# Patient Record
Sex: Male | Born: 1990 | Race: Black or African American | Hispanic: No | Marital: Single | Smoking: Current every day smoker
Health system: Southern US, Community
[De-identification: ages and names within clinical notes are randomized; demographics above are authoritative.]

---
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
## 2014-06-22 ENCOUNTER — Emergency Department (HOSPITAL_BASED_OUTPATIENT_CLINIC_OR_DEPARTMENT_OTHER)
Admission: EM | Admit: 2014-06-22 | Discharge: 2014-06-22 | Disposition: A | Discharge status: Home / Self Care | Payer: Self-pay | Attending: Emergency Medicine | Admitting: Emergency Medicine

## 2014-06-22 ENCOUNTER — Encounter (HOSPITAL_BASED_OUTPATIENT_CLINIC_OR_DEPARTMENT_OTHER): Payer: Self-pay | Admitting: Emergency Medicine

## 2014-06-22 ENCOUNTER — Emergency Department (HOSPITAL_BASED_OUTPATIENT_CLINIC_OR_DEPARTMENT_OTHER): Payer: Self-pay

## 2014-06-22 DIAGNOSIS — S022XXA Fracture of nasal bones, initial encounter for closed fracture: Secondary | ICD-10-CM

## 2014-06-22 DIAGNOSIS — Z23 Encounter for immunization: Secondary | ICD-10-CM | POA: Insufficient documentation

## 2014-06-22 DIAGNOSIS — F17200 Nicotine dependence, unspecified, uncomplicated: Secondary | ICD-10-CM | POA: Insufficient documentation

## 2014-06-22 DIAGNOSIS — S025XXA Fracture of tooth (traumatic), initial encounter for closed fracture: Secondary | ICD-10-CM

## 2014-06-22 MED ORDER — TETANUS-DIPHTH-ACELL PERTUSSIS 5-2.5-18.5 LF-MCG/0.5 IM SUSP
0.5000 mL | Freq: Once | INTRAMUSCULAR | Status: AC
  Administered 2014-06-22: 0.5 mL via INTRAMUSCULAR
  Filled 2014-06-22: qty 0.5

## 2014-06-22 MED ORDER — OXYCODONE-ACETAMINOPHEN 5-325 MG PO TABS: 1.0000 | ORAL_TABLET | Freq: Four times a day (QID) | ORAL | Status: AC | PRN

## 2014-06-22 MED ORDER — OXYCODONE-ACETAMINOPHEN 5-325 MG PO TABS
1.0000 | ORAL_TABLET | Freq: Once | ORAL | Status: AC
  Administered 2014-06-22: 1 via ORAL
  Filled 2014-06-22: qty 1

## 2014-06-22 NOTE — Discharge Instructions (Signed)
Dental Fracture You have a dental fracture or injury. This can mean the tooth is loose, has a chip in the enamel or is broken. If just the outer enamel is chipped, there is a good chance the tooth will not become infected. The only treatment needed may be to smooth off a rough edge. Fractures into the deeper layers (dentin and pulp) cause greater pain and are more likely to become infected. These require you to see a dentist as soon as possible to save the tooth. Loose teeth may need to be wired or bonded with a plastic splint to hold them in place. A paste may be painted on the open area of the broken tooth to reduce the pain. Antibiotics and pain medicine may be prescribed. Choosing a soft or liquid diet and rinsing the mouth out with warm water after meals may be helpful. See your dentist as recommended. Failure to seek care or follow up with a dentist or other specialist as recommended could result in the loss of your tooth, infection, or permanent dental problems. SEEK MEDICAL CARE IF:   You have increased pain not controlled with medicines.  You have swelling around the tooth, in the face or neck.  You have bleeding which starts, continues, or gets worse.  You have a fever. Document Released: 12/30/2004 Document Revised: 02/14/2012 Document Reviewed: 10/14/2009 [facility] Patient Information 2015 [place], [place]. This information is not intended to replace advice given to you by your health care provider. Make sure you discuss any questions you have with your health care provider.   Emergency Department Resource Guide 1) Find a Doctor and Pay Out of Pocket Although you won't have to find out who is covered by your insurance plan, it is a good idea to ask around and get recommendations. You will then need to call the office and see if the doctor you have chosen will accept you as a new patient and what types of options they offer for patients who are self-pay. Some doctors offer discounts or  will set up payment plans for their patients who do not have insurance, but you will need to ask so you aren't surprised when you get to your appointment.  2) Contact Your Local Health Department Not all health departments have doctors that can see patients for sick visits, but many do, so it is worth a call to see if yours does. If you don't know where your local health department is, you can check in your phone book. The CDC also has a tool to help you locate your state's health department, and many state websites also have listings of all of their local health departments.  3) Find a Walk-in Clinic If your illness is not likely to be very severe or complicated, you may want to try a walk in clinic. These are popping up all over the country in pharmacies, drugstores, and shopping centers. They're usually staffed by nurse practitioners or physician assistants that have been trained to treat common illnesses and complaints. They're usually fairly quick and inexpensive. However, if you have serious medical issues or chronic medical problems, these are probably not your best option.  No Primary Care Doctor: - Call Health Connect at  [phone] - they can help you locate a primary care doctor that  accepts your insurance, provides certain services, etc. - Physician Referral Service- [phone]  Chronic Pain Problems: Organization         Address  Phone   Notes  [facility]  [phone] Patients need to be referred by their primary care doctor.   Medication Assistance: Organization         Address  Phone   Notes  [facility] Medication [facility], [address] [place], [place] [address] [phone] --Must be a resident of [facility] -- Must have NO insurance coverage whatsoever (no Medicaid/ Medicare, etc.) -- The pt. MUST have a primary care doctor that directs their care regularly and follows them in the community   [facility]  [phone]   [employer]  [phone]    Agencies that provide inexpensive medical care: Organization         Address  Phone   Notes  [place]  [phone]   [facility]    [phone]   [facility] [place], [place] [address] [phone]   [place] [address], [place] [phone]   Planned Parenthood    [phone]   [facility]    [phone]   [place] [address] [place] [address], [place] Phone:  [phone], Fax:  [phone] Hours of Operation:  9 am - 6 pm, M-F.  Also accepts Medicaid/Medicare and self-pay.  [facility] for [address], [address], [place] Phone: [phone], Fax: [phone]. Hours of Operation:  8:30 am - 5:30 pm, M-F.  Also accepts Medicaid and self-pay.  [facility] [place] [address] [place] Phone: [phone]   [place], [place], [place] [phone], Ext. [phone] Mondays & Thursdays: 7-9 AM.  First 15 patients are seen on a first come, first serve basis.    [place] Providers:  Organization         Address  Phone   Notes  [facility] [address], [place] [phone] Also accepts self-pay patients.  [facility] [address], [place]  [phone]   [place], [address], [place] [phone]   [facility] Family Medicine [address] [place] [phone]   Lucianne Lei [address], [place]   [phone] Only accepts [place] Access [place] patients after they have their name applied to their card.   Self-Pay (no insurance) in [facility]:  Organization         Address  Phone   Notes  Sickle Cell Patients, [facility] Internal Medicine [place] [phone]   [facility] [place] [phone]   [facility] [place]  [place], [place], [place] [phone]   [facility]/Dr. Osei-Bonsu  [address], [place] [address], [place] [phone] Phone number for both [place] and [place] locations is the same.  [facility] and [facility] [address] [place] [phone]   [facility] [address] [place] or [address] [phone]   [facility] [address] [place] [phone], phone; [phone], fax Sees patients 1st and 3rd Saturday of every month.  Must not qualify for public or private insurance (i.e. Medicaid, Medicare, [place] Health Choice,  Veterans' Benefits)  Household income should be no more than 200% of the poverty level The clinic cannot treat you if you are pregnant or think you are pregnant  Sexually transmitted diseases are not treated at the clinic.    Dental Care: Organization         Address  Phone  Notes  [facility] [address], [place] [phone] Accepts children up to age 22 who are enrolled in [place] or [place] Health Choice; pregnant women with a Medicaid card; and children who have applied for Medicaid or [place] Health Choice, but were declined, whose parents can pay a reduced fee at time of service.  [facility] [place]  [address], [place] [phone] Accepts children up to age 22 who are enrolled in [place] or [place] Health Choice; pregnant women with a Medicaid card; and children who have applied for Medicaid or [place] Health Choice, but were declined, whose parents can pay a reduced fee at time of service.  [facility]  [address], [place] [phone]  [phone] Patients are seen by appointment only. Walk-ins are not accepted. [facility] will see patients 22 years of age and older. Monday - Tuesday (8am-5pm) Most Wednesdays (8:30-5pm) $30 per visit, cash only  [facility]  [address], [facility] [phone] Patients are seen by appointment only. Walk-ins are not accepted. [facility] will see patients 22 years of age and older. One Wednesday Evening (Monthly: Volunteer Based).  $30 per visit, cash only  [employer] [facility]  [phone] for adults; Children under age 584, call Graduate Pediatric Dentistry at [phone]. Children aged 504-14, please call [phone] to request a pediatric application.  Dental services are provided in all areas of dental care including fillings, crowns and bridges, complete and partial dentures, implants, gum treatment, root canals, and extractions. Preventive care is also provided. Treatment is provided to both adults and children. Patients are selected via a lottery and there is often a waiting list.   [facility] [address], [place]  [phone] [email or web address]   [facility] [address], [place], [place] [phone], Ext. [phone] Second and Fourth Thursday of each month, opens at 6:30 AM; Clinic ends at 9 AM.  Patients are seen on a first-come first-served basis, and a limited number are seen during each clinic.   [facility]  [address], [place], [place] [phone]   Eligibility Requirements You must have lived in [place], [place], or [place] counties for at least the last three months.   You cannot be eligible for state or federal sponsored [place] Cityhealthcare insurance, including CIGNAVeterans Administration, [place]icaid, or [employer] EntertainmentMedicare.   You generally cannot be eligible for healthcare insurance through your employer.    How to apply: Eligibility screenings are held every Tuesday and Wednesday afternoon  from 1:00 pm until 4:00 pm. You do not need an appointment for the interview!  [facility] [address], [place], [place] [phone]   [facility]  [phone]   [facility]  [phone]   [facility]  [phone]    Behavioral Health Resources in the Community: Intensive Outpatient Programs Organization         Address  Phone  Notes  [facility] [address], [place], [place] [phone]   [facility] Outpatient [address] [place], [place] [phone]   [facility] [address] [place], [place]  [phone]   [facility] Mental Health [address],  [place], [place] [phone] or [phone]   Substance Abuse Resources Organization         Address  Phone  Notes  [facility] Drug Services  [phone]   [facility]  [phone]   [facility] [place]  [phone]   Floydene Flock  [phone]   Residential & Outpatient Substance Abuse Program  [phone]   Psychological Services Organization         Address  Phone  Notes  [facility] Behavioral Health  [phone]   [facility]  [phone]   [facility] Mental Health [address] [place] [phone] or [phone]    Mobile Crisis Teams Organization         Address  Phone  Notes  [facility], Mobile Crisis Care Unit  [phone]   [facility]  [address] [place], [place] [phone]   Doristine Locks [address] [place] [phone]    Self-Help/Support Groups Organization         Address  Phone             Notes  Mental Health Assoc. of  - variety of support groups  336- I7437963 Call for more information  Narcotics Anonymous (NA), [facility] [address], [employer] [place]  2 meetings at this location   Nutritional therapist         Address  Phone  Notes  [facility] [address],    [place]  [phone]   [facility]  [address], [place] [address], [place], [place] [phone]   [facility] [address] [place], [place] [phone] Admissions: 8am-3pm M-F  [facility] [address]    [place], [place] [phone]   [facility] [address] [place], [place], [place] [phone]   [facility] [address],  [place], [place] [phone]   [facility] - Intensive Outpatient [address], [place], [place] [phone]   [facility] ([facility]iction [facility] [address] [place].,  [place], [place] [phone] or [phone]   [facility] ([facility]) [address], [place], [place] [phone] Accepts Medicaid  [facility] [place] [address]  [place] [phone] Substance Abuse/Addiction Treatment   [facility] Organization         Address  Phone  Notes  [facility]  [phone]   Angie Fava, PhD [address] [place], [place]   [phone] or [phone]   [facility] Behavioral   [address] [place], [place] [phone]   [facility] [address] [place], [place] [phone] Insurance/Medicaid/sponsorship through [facility] and Families [address]                                    [place], [place] [phone] Therapy/tele-psych/case  [facility] [address], [place] [phone]    Dr. Lolly Mustache  [phone]   [facility] [place]  [facility] Dept. 1) [address], [place] 2)  [place] 3)  [address], [place] [phone]  [phone]   [facility] Child Abuse Hotline [phone]) [phone] or [phone] (After Hours)
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
## 2014-06-22 NOTE — ED Notes (Signed)
Pt discharged to home with family. NAD.  
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
## 2017-02-28 ENCOUNTER — Emergency Department (HOSPITAL_BASED_OUTPATIENT_CLINIC_OR_DEPARTMENT_OTHER)
Admission: EM | Admit: 2017-02-28 | Discharge: 2017-02-28 | Disposition: A | Discharge status: Home / Self Care | Payer: Self-pay | Attending: Emergency Medicine | Admitting: Emergency Medicine

## 2017-02-28 ENCOUNTER — Emergency Department (HOSPITAL_BASED_OUTPATIENT_CLINIC_OR_DEPARTMENT_OTHER): Payer: Self-pay

## 2017-02-28 ENCOUNTER — Encounter (HOSPITAL_BASED_OUTPATIENT_CLINIC_OR_DEPARTMENT_OTHER): Payer: Self-pay

## 2017-02-28 DIAGNOSIS — Y999 Unspecified external cause status: Secondary | ICD-10-CM | POA: Insufficient documentation

## 2017-02-28 DIAGNOSIS — Y9389 Activity, other specified: Secondary | ICD-10-CM | POA: Insufficient documentation

## 2017-02-28 DIAGNOSIS — Y929 Unspecified place or not applicable: Secondary | ICD-10-CM | POA: Insufficient documentation

## 2017-02-28 DIAGNOSIS — S61230A Puncture wound without foreign body of right index finger without damage to nail, initial encounter: Secondary | ICD-10-CM | POA: Insufficient documentation

## 2017-02-28 DIAGNOSIS — L089 Local infection of the skin and subcutaneous tissue, unspecified: Secondary | ICD-10-CM

## 2017-02-28 DIAGNOSIS — F17200 Nicotine dependence, unspecified, uncomplicated: Secondary | ICD-10-CM | POA: Insufficient documentation

## 2017-02-28 DIAGNOSIS — W503XXA Accidental bite by another person, initial encounter: Secondary | ICD-10-CM

## 2017-02-28 DIAGNOSIS — S6000XA Contusion of unspecified finger without damage to nail, initial encounter: Secondary | ICD-10-CM

## 2017-02-28 MED ORDER — IBUPROFEN 400 MG PO TABS
600.0000 mg | ORAL_TABLET | Freq: Once | ORAL | Status: AC
  Administered 2017-02-28: 600 mg via ORAL
  Filled 2017-02-28: qty 1

## 2017-02-28 MED ORDER — NAPROXEN 500 MG PO TABS: 500.0000 mg | ORAL_TABLET | Freq: Two times a day (BID) | ORAL | 0 refills | Status: AC

## 2017-02-28 MED ORDER — SODIUM CHLORIDE 0.9 % IV SOLN
3.0000 g | Freq: Once | INTRAVENOUS | Status: AC
  Administered 2017-02-28: 3 g via INTRAVENOUS
  Filled 2017-02-28: qty 3

## 2017-02-28 MED ORDER — AMOXICILLIN-POT CLAVULANATE 875-125 MG PO TABS: 1.0000 | ORAL_TABLET | Freq: Two times a day (BID) | ORAL | 0 refills | Status: AC

## 2017-02-28 MED ORDER — TETANUS-DIPHTH-ACELL PERTUSSIS 5-2.5-18.5 LF-MCG/0.5 IM SUSP: 0.5000 mL | Freq: Once | INTRAMUSCULAR | Status: DC

## 2017-02-28 MED ORDER — AMOXICILLIN-POT CLAVULANATE 875-125 MG PO TABS
1.0000 | ORAL_TABLET | Freq: Once | ORAL | Status: AC
  Administered 2017-02-28: 1 via ORAL
  Filled 2017-02-28: qty 1

## 2017-02-28 NOTE — ED Notes (Signed)
ED Provider at bedside. 

## 2017-02-28 NOTE — ED Notes (Signed)
Abrasion and reddened area noted to right knuckle area, CMS intact
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
## 2017-02-28 NOTE — ED Notes (Signed)
Patient returned from XR. 

## 2017-02-28 NOTE — Discharge Instructions (Signed)
Soak the hand in warm water with antibacterial soap 3 times a day for 15 minutes at a time. Dr. Mina MarbleWeingold will see you in his office in the morning. Call the office in the morning to find out what time.
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
## 2023-01-26 ENCOUNTER — Encounter (HOSPITAL_COMMUNITY): Payer: Self-pay | Admitting: *Deleted

## 2023-01-26 ENCOUNTER — Emergency Department (HOSPITAL_COMMUNITY)
Admission: EM | Admit: 2023-01-26 | Discharge: 2023-01-27 | Disposition: A | Discharge status: Home / Self Care | Payer: Self-pay | Attending: Emergency Medicine | Admitting: Emergency Medicine

## 2023-01-26 ENCOUNTER — Other Ambulatory Visit: Payer: Self-pay

## 2023-01-26 DIAGNOSIS — J3489 Other specified disorders of nose and nasal sinuses: Secondary | ICD-10-CM | POA: Insufficient documentation

## 2023-01-26 DIAGNOSIS — Z1152 Encounter for screening for COVID-19: Secondary | ICD-10-CM | POA: Insufficient documentation

## 2023-01-26 DIAGNOSIS — B349 Viral infection, unspecified: Secondary | ICD-10-CM | POA: Insufficient documentation

## 2023-01-26 LAB — RESP PANEL BY RT-PCR (RSV, FLU A&B, COVID)  RVPGX2
Influenza A by PCR: NEGATIVE
Influenza B by PCR: NEGATIVE
Resp Syncytial Virus by PCR: NEGATIVE
SARS Coronavirus 2 by RT PCR: NEGATIVE
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
## 2023-01-26 NOTE — ED Provider Triage Note (Signed)
Emergency Medicine Provider Triage Evaluation Note  Angel Sullivan , a 32 y.o. male  was evaluated in triage.  Pt complains of URI symptoms beginning 2 days ago.  Patient reports positive sick contact.  Patient endorsing fever, nausea, vomiting, sore throat, cough, body aches and chills.  Denies chest pain or shortness of breath.  Review of Systems  Positive:  Negative:   Physical Exam  BP (!) 152/90   Pulse (!) 108   Temp 99.1 F (37.3 C) (Oral)   Resp 16   Ht 5' 10"$  (1.778 m)   Wt 95.3 kg   SpO2 99%   BMI 30.15 kg/m  Gen:   Awake, no distress   Resp:  Normal effort  MSK:   Moves extremities without difficulty  Other:    Medical Decision Making  Medically screening exam initiated at 8:57 PM.  Appropriate orders placed.  Angel Sullivan was informed that the remainder of the evaluation will be completed by another provider, this initial triage assessment does not replace that evaluation, and the importance of remaining in the ED until their evaluation is complete.     Angel Cecil, PA-C 01/26/23 2057

## 2023-01-27 ENCOUNTER — Emergency Department (HOSPITAL_COMMUNITY): Payer: Self-pay

## 2023-01-27 MED ORDER — ACETAMINOPHEN 500 MG PO TABS
1000.0000 mg | ORAL_TABLET | Freq: Once | ORAL | Status: AC
  Administered 2023-01-27: 1000 mg via ORAL
  Filled 2023-01-27: qty 2

## 2023-01-27 NOTE — Discharge Instructions (Signed)
You may take over-the-counter medicine for symptomatic relief, such as Tylenol, Motrin, TheraFlu, Alka seltzer , black elderberry, etc. Please limit acetaminophen (Tylenol) to 4000 mg and Ibuprofen (Motrin, Advil, etc.) to 2400 mg for a 24hr period. Please note that other over-the-counter medicine may contain acetaminophen or ibuprofen as a component of their ingredients.   

## 2023-01-27 NOTE — ED Provider Notes (Signed)
[facility] Provider Note  CSN: [id number] Arrival date & time: 01/26/23 2025  Chief Complaint(s) Generalized Body Aches  HPI Angel Sullivan is a 32 y.o. male    The history is provided by the patient.  URI Presenting symptoms: congestion, cough and fever   Severity:  Moderate Onset quality:  Gradual Duration:  2 days Timing:  Constant Progression:  Worsening Chronicity:  New Relieved by:  Nothing Associated symptoms: myalgias   Risk factors: sick contacts     Past Medical History History reviewed. No pertinent past medical history. There are no problems to display for this patient.  Home Medication(s) Prior to Admission medications   Medication Sig Start Date End Date Taking? Authorizing Provider  guaiFENesin (ROBITUSSIN) 100 MG/5ML liquid Take 15 mLs by mouth every 4 (four) hours as needed for cough or to loosen phlegm.   Yes [provider]  amoxicillin-clavulanate (AUGMENTIN) 875-125 MG tablet Take 1 tablet by mouth 2 (two) times daily. Patient not taking: Reported on 01/26/2023 02/28/17   Ashley Murrain, NP  naproxen (NAPROSYN) 500 MG tablet Take 1 tablet (500 mg total) by mouth 2 (two) times daily. Patient not taking: Reported on 01/26/2023 02/28/17   Ashley Murrain, NP  oxyCODONE-acetaminophen (PERCOCET) 5-325 MG per tablet Take 1 tablet by mouth every 6 (six) hours as needed. Patient not taking: Reported on 01/26/2023 06/22/14   Pamella Pert, MD                                                                                                                                    Allergies Patient has no known allergies.  Review of Systems Review of Systems  Constitutional:  Positive for fever.  HENT:  Positive for congestion.   Respiratory:  Positive for cough.   Musculoskeletal:  Positive for myalgias.   As noted in HPI  Physical Exam Vital Signs  I have reviewed the triage vital signs BP 123/76   Pulse  89   Temp (!) 100.5 F (38.1 C)   Resp 18   Ht 5' 10"$  (1.778 m)   Wt 95.3 kg   SpO2 100%   BMI 30.15 kg/m   Physical Exam Vitals reviewed.  Constitutional:      General: He is not in acute distress.    Appearance: He is well-developed. He is not diaphoretic.  HENT:     Head: Normocephalic and atraumatic.     Nose: Mucosal edema, congestion and rhinorrhea present.     Mouth/Throat:     Pharynx: Posterior oropharyngeal erythema (mild) present. No pharyngeal swelling or oropharyngeal exudate.     Tonsils: No tonsillar exudate or tonsillar abscesses.  Eyes:     General: No scleral icterus.       Right eye: No discharge.        Left eye: No discharge.     Conjunctiva/sclera: Conjunctivae normal.  Pupils: Pupils are equal, round, and reactive to light.  Cardiovascular:     Rate and Rhythm: Normal rate and regular rhythm.     Heart sounds: No murmur heard.    No friction rub. No gallop.  Pulmonary:     Effort: Pulmonary effort is normal. No respiratory distress.     Breath sounds: Normal breath sounds. No stridor. No rales.  Abdominal:     General: There is no distension.     Palpations: Abdomen is soft.     Tenderness: There is no abdominal tenderness.  Musculoskeletal:        General: No tenderness.     Cervical back: Normal range of motion and neck supple.  Skin:    General: Skin is warm and dry.     Findings: No erythema or rash.  Neurological:     Mental Status: He is alert and oriented to person, place, and time.     ED Results and Treatments Labs (all labs ordered are listed, but only abnormal results are displayed) Labs Reviewed  RESP PANEL BY RT-PCR (RSV, FLU A&B, COVID)  [id number]                                                                                                                         EKG  EKG Interpretation  Date/Time:    Ventricular Rate:    PR Interval:    QRS Duration:   QT Interval:    QTC Calculation:   R Axis:     Text  Interpretation:         Radiology DG Chest 2 View  Result Date: 01/27/2023 CLINICAL DATA:  32 year old male with cough and fever. EXAM: CHEST - 2 VIEW COMPARISON:  None Available. FINDINGS: Semi upright AP and lateral views at 0454 hours. Low lung volumes on both views. Normal cardiac size and mediastinal contours. Visualized tracheal air column is within normal limits. Allowing for mild crowding of lung markings bilaterally, both lungs appear clear. No pneumothorax or pleural effusion. Visible bowel-gas pattern within normal limits. No osseous abnormality identified. IMPRESSION: Low lung volumes, otherwise no acute cardiopulmonary abnormality. Electronically Signed   By: Genevie Ann M.D.   On: 01/27/2023 05:13    Medications Ordered in ED Medications  acetaminophen (TYLENOL) tablet 1,000 mg (1,000 mg Oral Given 01/27/23 0453)  Procedures Procedures  (including critical care time)  Medical Decision Making / ED Course   Medical Decision Making Amount and/or Complexity of Data Reviewed Radiology: ordered.  Risk OTC drugs.    Patient presents with viral symptoms for 2 days. adequate oral hydration. Rest of history as above.  Patient appears well. No signs of toxicity, patient is interactive. No hypoxia, tachypnea or other signs of respiratory distress. No sign of clinical dehydration. Lung exam clear. Rest of exam as above.   CXR negative, COVID/Flu/RSV negative  Most consistent with viral illness   No evidence suggestive of pharyngitis, AOM, PNA, or meningitis.   Discussed symptomatic treatment with the patient and they will follow closely with their PCP.         Final Clinical Impression(s) / ED Diagnoses Final diagnoses:  Viral syndrome   The patient appears reasonably screened and/or stabilized for discharge and I doubt any other medical  condition or other [facility] requiring further screening, evaluation, or treatment in the ED at this time. I have discussed the findings, Dx and Tx plan with the patient/family who expressed understanding and agree(s) with the plan. Discharge instructions discussed at length. The patient/family was given strict return precautions who verbalized understanding of the instructions. No further questions at time of discharge.  Disposition: Discharge  Condition: Good  ED Discharge Orders     None        Follow Up: Primary care provider  Call  as needed           This chart was dictated using voice recognition software.  Despite best efforts to proofread,  errors can occur which can change the documentation meaning.    Fatima Blank, MD 01/27/23 [phone]

## 2024-01-10 ENCOUNTER — Encounter (HOSPITAL_COMMUNITY): Payer: Self-pay

## 2024-01-10 ENCOUNTER — Emergency Department (HOSPITAL_COMMUNITY)
Admission: EM | Admit: 2024-01-10 | Discharge: 2024-01-10 | Discharge status: Against Medical Advice | Payer: BC Managed Care – PPO | Attending: Emergency Medicine | Admitting: Emergency Medicine

## 2024-01-10 ENCOUNTER — Other Ambulatory Visit: Payer: Self-pay

## 2024-01-10 DIAGNOSIS — M545 Low back pain, unspecified: Secondary | ICD-10-CM | POA: Insufficient documentation

## 2024-01-10 DIAGNOSIS — Z5321 Procedure and treatment not carried out due to patient leaving prior to being seen by health care provider: Secondary | ICD-10-CM | POA: Diagnosis not present

## 2024-01-10 NOTE — ED Triage Notes (Addendum)
Pt here for lower back pain that started last Thursday. C/O difficulty standing up straight. Pt states he does a lot of strenuous activity for work. Denies numbness and tingling legs. Denies bowel and bladder issues.

## 2024-10-29 ENCOUNTER — Encounter (HOSPITAL_COMMUNITY): Payer: Self-pay | Admitting: *Deleted

## 2024-10-29 ENCOUNTER — Other Ambulatory Visit: Payer: Self-pay

## 2024-10-29 ENCOUNTER — Emergency Department (HOSPITAL_COMMUNITY)
Admission: EM | Admit: 2024-10-29 | Discharge: 2024-10-29 | Disposition: A | Discharge status: Home / Self Care | Payer: Self-pay | Attending: Emergency Medicine | Admitting: Emergency Medicine

## 2024-10-29 DIAGNOSIS — J069 Acute upper respiratory infection, unspecified: Secondary | ICD-10-CM | POA: Insufficient documentation

## 2024-10-29 MED ORDER — CETIRIZINE HCL 10 MG PO TABS: 10.0000 mg | ORAL_TABLET | Freq: Every day | ORAL | 0 refills | Status: AC

## 2024-10-29 MED ORDER — FLUTICASONE PROPIONATE 50 MCG/ACT NA SUSP: 2.0000 | Freq: Every day | NASAL | 2 refills | Status: AC

## 2024-10-29 NOTE — ED Notes (Signed)
 Patient dc by RN. Patient verbalizes understanding of instructions. Ambulatory to lobby.

## 2024-10-29 NOTE — ED Provider Notes (Signed)
 [facility] Provider Note   CSN: [id number] Arrival date & time: 10/29/24  9065     Patient presents with: Nasal Congestion   Angel Sullivan is a 33 y.o. male presents for nasal congestion, sore throat, runny nose over the last 4 to 5 days.  Has been attempting over-the-counter remedies with minimal improvement in his condition.  Seeks evaluation for management of this persistent congestion as well as generalized fatigue and malaise.  No previous medical conditions noted.  Does not have any history of reactive airway disease or any other pulmonary disease.   HPI     Prior to Admission medications   Medication Sig Start Date End Date Taking? Authorizing Provider  cetirizine  (ZYRTEC  ALLERGY) 10 MG tablet Take 1 tablet (10 mg total) by mouth daily. 10/29/24  Yes Myriam Dorn BROCKS, PA  fluticasone  (FLONASE ) 50 MCG/ACT nasal spray Place 2 sprays into both nostrils daily. 10/29/24  Yes Myriam Dorn BROCKS, PA  amoxicillin -clavulanate (AUGMENTIN ) 875-125 MG tablet Take 1 tablet by mouth 2 (two) times daily. Patient not taking: Reported on 01/26/2023 02/28/17   Jamelle Lorrayne HERO, NP  guaiFENesin (ROBITUSSIN) 100 MG/5ML liquid Take 15 mLs by mouth every 4 (four) hours as needed for cough or to loosen phlegm.    [provider]  naproxen  (NAPROSYN ) 500 MG tablet Take 1 tablet (500 mg total) by mouth 2 (two) times daily. Patient not taking: Reported on 01/26/2023 02/28/17   Jamelle Lorrayne HERO, NP  oxyCODONE -acetaminophen  (PERCOCET) 5-325 MG per tablet Take 1 tablet by mouth every 6 (six) hours as needed. Patient not taking: Reported on 01/26/2023 06/22/14   Margrette Pear, MD    Allergies: Patient has no known allergies.    Review of Systems  HENT:  Positive for congestion, postnasal drip, rhinorrhea, sinus pressure and sore throat.   All other systems reviewed and are negative.   Updated Vital Signs BP (!) 139/96 (BP Location: Left Arm)    Pulse 92   Temp 97.7 F (36.5 C)   Resp 18   Ht 5' 10 (1.778 m)   Wt 95.3 kg   SpO2 97%   BMI 30.13 kg/m   Physical Exam Vitals and nursing note reviewed.  Constitutional:      General: He is awake. He is not in acute distress.    Appearance: Normal appearance. He is well-developed, well-groomed and normal weight.  HENT:     Head: Normocephalic and atraumatic.     Right Ear: Hearing, tympanic membrane, ear canal and external ear normal.     Left Ear: Hearing, tympanic membrane, ear canal and external ear normal.     Nose: Mucosal edema, congestion and rhinorrhea present. Rhinorrhea is clear.     Right Turbinates: Swollen.     Left Turbinates: Swollen.     Right Sinus: No maxillary sinus tenderness or frontal sinus tenderness.     Left Sinus: No maxillary sinus tenderness or frontal sinus tenderness.     Mouth/Throat:     Lips: Pink.     Mouth: Mucous membranes are moist.     Palate: No mass and lesions.     Pharynx: Oropharynx is clear. Uvula midline. Posterior oropharyngeal erythema and postnasal drip present. No pharyngeal swelling or oropharyngeal exudate.     Tonsils: No tonsillar exudate or tonsillar abscesses.  Eyes:     General: Lids are normal. Vision grossly intact. Gaze aligned appropriately.     Extraocular Movements: Extraocular movements intact.  Conjunctiva/sclera: Conjunctivae normal.     Pupils: Pupils are equal, round, and reactive to light.  Cardiovascular:     Rate and Rhythm: Normal rate and regular rhythm.     Pulses: Normal pulses.     Heart sounds: Normal heart sounds. No murmur heard.    No friction rub. No gallop.  Pulmonary:     Effort: Pulmonary effort is normal.     Breath sounds: Normal breath sounds.  Abdominal:     General: Abdomen is flat. Bowel sounds are normal.     Palpations: Abdomen is soft.  Musculoskeletal:        General: Normal range of motion.     Cervical back: Full passive range of motion without pain, normal range of  motion and neck supple.     Right lower leg: No edema.     Left lower leg: No edema.  Lymphadenopathy:     Cervical: No cervical adenopathy.  Skin:    General: Skin is warm and dry.     Capillary Refill: Capillary refill takes less than 2 seconds.  Neurological:     General: No focal deficit present.     Mental Status: He is alert and oriented to person, place, and time. Mental status is at baseline.     GCS: GCS eye subscore is 4. GCS verbal subscore is 5. GCS motor subscore is 6.  Psychiatric:        Mood and Affect: Mood normal.        Behavior: Behavior is cooperative.     (all labs ordered are listed, but only abnormal results are displayed) Labs Reviewed - No data to display  EKG: None  Radiology: No results found.   Procedures   Medications Ordered in the ED - No data to display                                  Medical Decision Making Risk OTC drugs.   Based on presenting symptoms and physical exam, differential diagnosis primarily includes viral URI with cough, however consider possible bacterial sinusitis given the patient does not have any sinus tenderness, and signs and symptoms of pain only present over the last 5 days.  No history of asthma or COPD, otherwise would consider possible asthma or COPD exacerbation.  Pulmonary auscultation is unremarkable, therefore not suspect for pneumonia at this time.  Given his persistent cough, postnasal drip appreciated on physical exam, find this is likely consistent with a URI with associated cough.  Will manage symptomatically with over-the-counter medications but will also provide him with a course of cetirizine  as well as fluticasone  nasal spray.  Refer to primary care for continued follow-up, discussed rest and hydration for palliative care.  Patient understands and agrees has no further concerns at this time, otherwise stable and clear for discharge and outpatient management.     Final diagnoses:  Viral URI with  cough    ED Discharge Orders          Ordered    cetirizine  (ZYRTEC  ALLERGY) 10 MG tablet  Daily        10/29/24 1030    fluticasone  (FLONASE ) 50 MCG/ACT nasal spray  Daily        10/29/24 1030               Myriam Dorn BROCKS, [place] 10/29/24 1035    Armenta Canning, MD 10/30/24 [phone]

## 2024-10-29 NOTE — ED Triage Notes (Signed)
 Pt states he has had cold symptoms w/nasal congestion and cough for past few days. Pt has tried multiple OTC medications w/o relief. Pt denies fevers.
